# Patient Record
Sex: Male | Born: 1980 | Race: Black or African American | Hispanic: No | Marital: Single | Smoking: Current every day smoker
Health system: Southern US, Community
[De-identification: ages and names within clinical notes are randomized; demographics above are authoritative.]

---

## 2009-03-10 ENCOUNTER — Emergency Department (HOSPITAL_COMMUNITY): Admission: EM | Admit: 2009-03-10 | Discharge: 2009-03-10 | Payer: Self-pay | Admitting: Emergency Medicine

## 2014-02-18 ENCOUNTER — Emergency Department (HOSPITAL_BASED_OUTPATIENT_CLINIC_OR_DEPARTMENT_OTHER)
Admission: EM | Admit: 2014-02-18 | Discharge: 2014-02-18 | Disposition: A | Discharge status: Home / Self Care | Payer: Self-pay | Attending: Emergency Medicine | Admitting: Emergency Medicine

## 2014-02-18 ENCOUNTER — Encounter (HOSPITAL_BASED_OUTPATIENT_CLINIC_OR_DEPARTMENT_OTHER): Payer: Self-pay | Admitting: Emergency Medicine

## 2014-02-18 DIAGNOSIS — N342 Other urethritis: Secondary | ICD-10-CM | POA: Insufficient documentation

## 2014-02-18 DIAGNOSIS — R300 Dysuria: Secondary | ICD-10-CM | POA: Insufficient documentation

## 2014-02-18 DIAGNOSIS — F17200 Nicotine dependence, unspecified, uncomplicated: Secondary | ICD-10-CM | POA: Insufficient documentation

## 2014-02-18 LAB — RPR

## 2014-02-18 LAB — HIV ANTIBODY (ROUTINE TESTING W REFLEX): HIV 1&2 Ab, 4th Generation: NONREACTIVE

## 2014-02-18 MED ORDER — CEFTRIAXONE SODIUM 250 MG IJ SOLR
250.0000 mg | Freq: Once | INTRAMUSCULAR | Status: AC
  Administered 2014-02-18: 250 mg via INTRAMUSCULAR
  Filled 2014-02-18: qty 250

## 2014-02-18 MED ORDER — AZITHROMYCIN 250 MG PO TABS
1000.0000 mg | ORAL_TABLET | Freq: Once | ORAL | Status: AC
  Administered 2014-02-18: 1000 mg via ORAL
  Filled 2014-02-18: qty 4

## 2014-02-18 NOTE — ED Provider Notes (Signed)
CSN: [id number]     Arrival date & time 02/18/14  3086 History   First MD Initiated Contact with Patient 02/18/14 [phone]     Chief Complaint  Patient presents with  . Dysuria     (Consider location/radiation/quality/duration/timing/severity/associated sxs/prior Treatment) HPI This is a 33 year old male with a three-day history of dysuria. He complains of burning during urination. The symptoms are mild to moderate. He denies urethral discharge. He denies fever, chills, nausea, vomiting, diarrhea or abdominal pain. He states he is married and monogamous.  History reviewed. No pertinent past medical history. History reviewed. No pertinent past surgical history. No family history on file. History  Substance Use Topics  . Smoking status: Current Every Day Smoker  . Smokeless tobacco: Not on file  . Alcohol Use: Yes    Review of Systems  All other systems reviewed and are negative.   Allergies  Review of patient's allergies indicates no known allergies.  Home Medications   Prior to Admission medications   Not on File   BP 120/73  Pulse 90  Temp(Src) 99.8 F (37.7 C) (Oral)  Resp 16  Ht  (1.702 m)  Wt 212 lb (96.163 kg)  BMI 33.20 kg/m2  SpO2 99%  Physical Exam General: Well-developed, well-nourished male in no acute distress; appearance consistent with age of record HENT: normocephalic; atraumatic Eyes: Normal appearance Neck: supple Heart: regular rate and rhythm Lungs: clear to auscultation bilaterally Abdomen: soft; nondistended; nontender; no masses or hepatosplenomegaly; bowel sounds present GU: Tanner 5 male, uncircumcised; no urethral discharge; urine clear yellow Extremities: No deformity; full range of motion Neurologic: Awake, alert and oriented; motor function intact in all extremities and symmetric; no facial droop Skin: Warm and dry Psychiatric: Normal mood and affect    ED Course  Procedures (including critical care time)  MDM  Will test for  STDs and treat presumptively for GC and chlamydia.     Hanley Seamen, MD 02/18/14 [phone]

## 2014-02-18 NOTE — Discharge Instructions (Signed)
Urethritis °Urethritis is an inflammation of the tube through which urine exits your bladder (urethra).  °CAUSES °Urethritis is often caused by an infection in your urethra. The infection can be viral, like herpes. The infection can also be bacterial, like gonorrhea. °RISK FACTORS °Risk factors of urethritis include: °· Having sex without using a condom. °· Having multiple sexual partners. °· Having poor hygiene. °SIGNS AND SYMPTOMS °Symptoms of urethritis are less noticeable in women than in men. These symptoms include: °· Burning feeling when you urinate (dysuria). °· Discharge from your urethra. °· Blood in your urine (hematuria). °· Urinating more than usual. °DIAGNOSIS  °To confirm a diagnosis of urethritis, your health care provider will do the following: °· Ask about your sexual history. °· Perform a physical exam. °· Have you provide a sample of your urine for lab testing. °· Use a cotton swab to gently collect a sample from your urethra for lab testing. °TREATMENT  °It is important to treat urethritis. Depending on the cause, untreated urethritis may lead to serious genital infections and possibly infertility. Urethritis caused by a bacterial infection is treated with antibiotic medicine. All sexual partners must be treated.  °HOME CARE INSTRUCTIONS °· Do not have sex until the test results are known and treatment is completed, even if your symptoms go away before you finish treatment. °· If you were prescribed an antibiotic, finish it all even if you start to feel better. °SEEK MEDICAL CARE IF:  °· Your symptoms are not improved in 3 days. °· Your symptoms are getting worse. °· You develop abdominal pain or pelvic pain (in women). °· You develop joint pain. °· You have a fever. °SEEK IMMEDIATE MEDICAL CARE IF:  °· You have severe pain in the belly, back, or side. °· You have repeated vomiting. °MAKE SURE YOU: °· Understand these instructions. °· Will watch your condition. °· Will get help right away if you  are not doing well or get worse. °Document Released: 11/25/2000 Document Revised: 10/16/2013 Document Reviewed: 01/30/2013 °ExitCare® Patient Information ©2015 [employer]. This information is not intended to replace advice given to you by your health care provider. Make sure you discuss any questions you have with your health care provider. ° °

## 2014-02-18 NOTE — ED Notes (Addendum)
Pt up to b/r for urine sample, CCUA explained, alert, NAD, calm, interactive, steady gait.

## 2014-02-18 NOTE — ED Notes (Addendum)
C/o intermittent dysuria, onset 3-4d ago, (denies: abd pain, back pain, itching, d/c, nvd, fever or other sx), no meds PTA. Denies pain or discomfort at this time. No PCP.

## 2014-02-19 LAB — URINE CULTURE
COLONY COUNT: NO GROWTH
CULTURE: NO GROWTH

## 2014-02-20 LAB — GC/CHLAMYDIA PROBE AMP
CT Probe RNA: POSITIVE — AB
GC PROBE AMP APTIMA: NEGATIVE

## 2014-02-21 ENCOUNTER — Telehealth (HOSPITAL_BASED_OUTPATIENT_CLINIC_OR_DEPARTMENT_OTHER): Payer: Self-pay | Admitting: Emergency Medicine

## 2014-02-21 NOTE — Telephone Encounter (Signed)
Post ED Visit - Positive Culture Follow-up  Culture report reviewed by antimicrobial stewardship pharmacist:  Wes Dulaney, Pharm.D., BCPS  Celedonio Miyamoto, Pharm.D., BCPS  Georgina Pillion, [address].D., BCPS  [place], [address].D., BCPS, AAHIVP  Estella Husk, Pharm.D., BCPS, AAHIVP  Carly Sabat, Pharm.D.  Enzo Bi, Pharm.D.  Positive chlamydia culture Treated with zithromax and rocephin, organism sensitive to the same and no further patient follow-up is required at this time.  Berle Mull 02/21/2014, 12:50 PM

## 2014-04-11 ENCOUNTER — Emergency Department (HOSPITAL_BASED_OUTPATIENT_CLINIC_OR_DEPARTMENT_OTHER): Payer: Self-pay

## 2014-04-11 ENCOUNTER — Encounter (HOSPITAL_BASED_OUTPATIENT_CLINIC_OR_DEPARTMENT_OTHER): Payer: Self-pay | Admitting: Emergency Medicine

## 2014-04-11 ENCOUNTER — Emergency Department (HOSPITAL_BASED_OUTPATIENT_CLINIC_OR_DEPARTMENT_OTHER)
Admission: EM | Admit: 2014-04-11 | Discharge: 2014-04-11 | Disposition: A | Discharge status: Home / Self Care | Payer: Self-pay | Attending: Emergency Medicine | Admitting: Emergency Medicine

## 2014-04-11 DIAGNOSIS — Z720 Tobacco use: Secondary | ICD-10-CM | POA: Insufficient documentation

## 2014-04-11 DIAGNOSIS — W19XXXA Unspecified fall, initial encounter: Secondary | ICD-10-CM

## 2014-04-11 DIAGNOSIS — M545 Low back pain, unspecified: Secondary | ICD-10-CM

## 2014-04-11 DIAGNOSIS — W109XXA Fall (on) (from) unspecified stairs and steps, initial encounter: Secondary | ICD-10-CM | POA: Insufficient documentation

## 2014-04-11 DIAGNOSIS — Z791 Long term (current) use of non-steroidal anti-inflammatories (NSAID): Secondary | ICD-10-CM | POA: Insufficient documentation

## 2014-04-11 DIAGNOSIS — Y9289 Other specified places as the place of occurrence of the external cause: Secondary | ICD-10-CM | POA: Insufficient documentation

## 2014-04-11 DIAGNOSIS — Y9389 Activity, other specified: Secondary | ICD-10-CM | POA: Insufficient documentation

## 2014-04-11 DIAGNOSIS — S3992XA Unspecified injury of lower back, initial encounter: Secondary | ICD-10-CM | POA: Insufficient documentation

## 2014-04-11 MED ORDER — IBUPROFEN 800 MG PO TABS: 800.0000 mg | ORAL_TABLET | Freq: Three times a day (TID) | ORAL | Status: DC

## 2014-04-11 NOTE — ED Notes (Signed)
Slipped/fell yesterday-c/o lower back pain-steady gait into triage

## 2014-04-11 NOTE — ED Notes (Signed)
Pt reports slipping on stairs and falling down backwards approximately 3 steps. Reports more painful with movement. Rates pain 6/10 at this time. Able to move all extremities without difficulty.

## 2014-04-11 NOTE — ED Provider Notes (Signed)
CSN: [id number]     Arrival date & time 04/11/14  1208 History   First MD Initiated Contact with Patient 04/11/14 1223     Chief Complaint  Patient presents with  . Fall     (Consider location/radiation/quality/duration/timing/severity/associated sxs/prior Treatment) HPI Comments: This is a 33 year old male who presents to the emergency department complaining of low back pain 1 day. Patient reports he slipped on the steps around 5:30 PM yesterday and hit his lower back on the step. Pain has been intermittent since, worse with sitting, relieved by standing and icy hot patch. Denies pain, numbness or tingling radiating down his extremities. No loss control of bowels or bladder saddle anesthesia.  Patient is a 33 y.o. male presenting with fall. The history is provided by the patient.  Fall    History reviewed. No pertinent past medical history. History reviewed. No pertinent past surgical history. No family history on file. History  Substance Use Topics  . Smoking status: Current Every Day Smoker  . Smokeless tobacco: Not on file  . Alcohol Use: Yes    Review of Systems  Musculoskeletal: Positive for back pain.  All other systems reviewed and are negative.     Allergies  Review of patient's allergies indicates no known allergies.  Home Medications   Prior to Admission medications   Medication Sig Start Date End Date Taking? Authorizing Provider  acetaminophen (TYLENOL) 500 MG tablet Take 500 mg by mouth every 6 (six) hours as needed.   Yes Historical Provider, MD  ibuprofen (ADVIL,MOTRIN) 800 MG tablet Take 1 tablet (800 mg total) by mouth 3 (three) times daily. 04/11/14   Amilliana Hayworth M Jaonna Word, PA-C   BP 126/88  Pulse 100  Temp(Src) 98.4 F (36.9 C) (Oral)  Resp 16  Ht 5\' 11"  (1.803 m)  Wt 200 lb (90.719 kg)  BMI 27.91 kg/m2  SpO2 100% Physical Exam  Nursing note and vitals reviewed. Constitutional: He is oriented to person, place, and time. He appears well-developed and  well-nourished. No distress.  HENT:  Head: Normocephalic and atraumatic.  Mouth/Throat: Oropharynx is clear and moist.  Eyes: Conjunctivae are normal.  Neck: Normal range of motion. Neck supple. No spinous process tenderness and no muscular tenderness present.  Cardiovascular: Normal rate, regular rhythm and normal heart sounds.   Pulmonary/Chest: Effort normal and breath sounds normal. No respiratory distress.  Musculoskeletal: He exhibits no edema.       Lumbar back: He exhibits normal range of motion.       Back:  Neurological: He is alert and oriented to person, place, and time. He has normal strength.  Strength lower extremities 5/5 and equal bilateral. Sensation intact. Normal gait.  Skin: Skin is warm and dry. No rash noted. He is not diaphoretic.  Psychiatric: He has a normal mood and affect. His behavior is normal.    ED Course  Procedures (including critical care time) Labs Review Labs Reviewed - No data to display  Imaging Review Dg Lumbar Spine Complete  04/11/2014   CLINICAL DATA:  Lower back pain after fall down stairs.  EXAM: LUMBAR SPINE - COMPLETE 4+ VIEW  COMPARISON:  None.  FINDINGS: There is no evidence of lumbar spine fracture. Alignment is normal. Intervertebral disc spaces are maintained.  IMPRESSION: Normal lumbar spine.   Electronically Signed   By: Roque LiasJames  Green M.D.   On: 04/11/2014 13:35     EKG Interpretation None      MDM   Final diagnoses:  Fall  Lumbar pain  on palpation   Patient with back pain after fall. Neurovascularly intact. No neurologic deficits. No signs of central cord compression. Ambulates without difficulty. X-ray without any acute findings. Advised NSAIDs, rest, ice/heat. Stable for discharge. Return precautions given. Patient states understanding of treatment care plan and is agreeable.  Kathrynn SpeedRobyn M Elfego Giammarino, PA-C 04/11/14 1348

## 2014-04-11 NOTE — Discharge Instructions (Signed)
Take ibuprofen as directed as needed for pain. Rest, ice and avoid heavy lifting or physical activity for the next few days.  Lumbosacral Strain Lumbosacral strain is a strain of any of the parts that make up your lumbosacral vertebrae. Your lumbosacral vertebrae are the bones that make up the lower third of your backbone. Your lumbosacral vertebrae are held together by muscles and tough, fibrous tissue (ligaments).  CAUSES  A sudden blow to your back can cause lumbosacral strain. Also, anything that causes an excessive stretch of the muscles in the low back can cause this strain. This is typically seen when people exert themselves strenuously, fall, lift heavy objects, bend, or crouch repeatedly. RISK FACTORS  Physically demanding work.  Participation in pushing or pulling sports or sports that require a sudden twist of the back (tennis, golf, baseball).  Weight lifting.  Excessive lower back curvature.  Forward-tilted pelvis.  Weak back or abdominal muscles or both.  Tight hamstrings. SIGNS AND SYMPTOMS  Lumbosacral strain may cause pain in the area of your injury or pain that moves (radiates) down your leg.  DIAGNOSIS Your health care provider can often diagnose lumbosacral strain through a physical exam. In some cases, you may need tests such as X-ray exams.  TREATMENT  Treatment for your lower back injury depends on many factors that your clinician will have to evaluate. However, most treatment will include the use of anti-inflammatory medicines. HOME CARE INSTRUCTIONS   Avoid hard physical activities (tennis, racquetball, waterskiing) if you are not in proper physical condition for it. This may aggravate or create problems.  If you have a back problem, avoid sports requiring sudden body movements. Swimming and walking are generally safer activities.  Maintain good posture.  Maintain a healthy weight.  For acute conditions, you may put ice on the injured area.  Put ice in  a plastic bag.  Place a towel between your skin and the bag.  Leave the ice on for 20 minutes, 2-3 times a day.  When the low back starts healing, stretching and strengthening exercises may be recommended. SEEK MEDICAL CARE IF:  Your back pain is getting worse.  You experience severe back pain not relieved with medicines. SEEK IMMEDIATE MEDICAL CARE IF:   You have numbness, tingling, weakness, or problems with the use of your arms or legs.  There is a change in bowel or bladder control.  You have increasing pain in any area of the body, including your belly (abdomen).  You notice shortness of breath, dizziness, or feel faint.  You feel sick to your stomach (nauseous), are throwing up (vomiting), or become sweaty.  You notice discoloration of your toes or legs, or your feet get very cold. MAKE SURE YOU:   Understand these instructions.  Will watch your condition.  Will get help right away if you are not doing well or get worse. Document Released: 03/11/2005 Document Revised: 06/06/2013 Document Reviewed: 01/18/2013 [facility] LLCExitCare Patient Information 2015 [place]Care, [place]. This information is not intended to replace advice given to you by your health care provider. Make sure you discuss any questions you have with your health care provider.  Musculoskeletal Pain Musculoskeletal pain is muscle and boney aches and pains. These pains can occur in any part of the body. Your caregiver may treat you without knowing the cause of the pain. They may treat you if blood or urine tests, X-rays, and other tests were normal.  CAUSES There is often not a definite cause or reason for these pains.  These pains may be caused by a type of germ (virus). The discomfort may also come from overuse. Overuse includes working out too hard when your body is not fit. Boney aches also come from weather changes. Bone is sensitive to atmospheric pressure changes. HOME CARE INSTRUCTIONS   Ask when your test results  will be ready. Make sure you get your test results.  Only take over-the-counter or prescription medicines for pain, discomfort, or fever as directed by your caregiver. If you were given medications for your condition, do not drive, operate machinery or power tools, or sign legal documents for 24 hours. Do not drink alcohol. Do not take sleeping pills or other medications that may interfere with treatment.  Continue all activities unless the activities cause more pain. When the pain lessens, slowly resume normal activities. Gradually increase the intensity and duration of the activities or exercise.  During periods of severe pain, bed rest may be helpful. Lay or sit in any position that is comfortable.  Putting ice on the injured area.  Put ice in a bag.  Place a towel between your skin and the bag.  Leave the ice on for 15 to 20 minutes, 3 to 4 times a day.  Follow up with your caregiver for continued problems and no reason can be found for the pain. If the pain becomes worse or does not go away, it may be necessary to repeat tests or do additional testing. Your caregiver may need to look further for a possible cause. SEEK IMMEDIATE MEDICAL CARE IF:  You have pain that is getting worse and is not relieved by medications.  You develop chest pain that is associated with shortness or breath, sweating, feeling sick to your stomach (nauseous), or throw up (vomit).  Your pain becomes localized to the abdomen.  You develop any new symptoms that seem different or that concern you. MAKE SURE YOU:   Understand these instructions.  Will watch your condition.  Will get help right away if you are not doing well or get worse. Document Released: 06/01/2005 Document Revised: 08/24/2011 Document Reviewed: 02/03/2013 [facility]ExitCare Patient Information 2015 [place]ExitCare, [place]LLC. This information is not intended to replace advice given to you by your health care provider. Make sure you discuss any questions you  have with your health care provider.

## 2014-04-13 NOTE — ED Provider Notes (Signed)
Medical screening examination/treatment/procedure(s) were performed by non-physician practitioner and as supervising physician I was immediately available for consultation/collaboration.   EKG Interpretation None        Mirian MoMatthew Gentry, MD 04/13/14 [phone]

## 2015-02-12 ENCOUNTER — Emergency Department (HOSPITAL_BASED_OUTPATIENT_CLINIC_OR_DEPARTMENT_OTHER)
Admission: EM | Admit: 2015-02-12 | Discharge: 2015-02-12 | Disposition: A | Discharge status: Home / Self Care | Payer: Self-pay | Attending: Emergency Medicine | Admitting: Emergency Medicine

## 2015-02-12 ENCOUNTER — Encounter (HOSPITAL_BASED_OUTPATIENT_CLINIC_OR_DEPARTMENT_OTHER): Payer: Self-pay | Admitting: *Deleted

## 2015-02-12 DIAGNOSIS — K047 Periapical abscess without sinus: Secondary | ICD-10-CM | POA: Insufficient documentation

## 2015-02-12 DIAGNOSIS — Z720 Tobacco use: Secondary | ICD-10-CM | POA: Insufficient documentation

## 2015-02-12 DIAGNOSIS — K029 Dental caries, unspecified: Secondary | ICD-10-CM | POA: Insufficient documentation

## 2015-02-12 MED ORDER — PENICILLIN V POTASSIUM 500 MG PO TABS: 500.0000 mg | ORAL_TABLET | Freq: Three times a day (TID) | ORAL | Status: AC

## 2015-02-12 MED ORDER — HYDROCODONE-ACETAMINOPHEN 5-325 MG PO TABS: 1.0000 | ORAL_TABLET | Freq: Four times a day (QID) | ORAL | Status: AC | PRN

## 2015-02-12 NOTE — ED Provider Notes (Signed)
CSN: [id number]     Arrival date & time 02/12/15  0931 History   First MD Initiated Contact with Patient 02/12/15 1011     No chief complaint on file.    (Consider location/radiation/quality/duration/timing/severity/associated sxs/prior Treatment) HPI Comments: Patient is a 34 year old male who presents with complaints of right upper dental pain and facial swelling for the past 3 days.  Patient is a 34 y.o. male presenting with tooth pain. The history is provided by the patient.  Dental Pain Location:  Upper Upper teeth location:  2/RU 2nd molar Quality:  Throbbing Severity:  Moderate Onset quality:  Gradual Duration:  3 days Timing:  Constant Progression:  Worsening Chronicity:  New   History reviewed. No pertinent past medical history. History reviewed. No pertinent past surgical history. No family history on file. Social History  Substance Use Topics  . Smoking status: Current Every Day Smoker -- 0.50 packs/day    Types: Cigarettes  . Smokeless tobacco: None  . Alcohol Use: Yes    Review of Systems  All other systems reviewed and are negative.     Allergies  Review of patient's allergies indicates no known allergies.  Home Medications   Prior to Admission medications   Not on File   BP 148/97 mmHg  Pulse 67  Temp(Src) 100.3 F (37.9 C) (Oral)  Resp 16  Ht  (1.803 m)  Wt 185 lb (83.915 kg)  BMI 25.81 kg/m2  SpO2 98% Physical Exam  Constitutional: He is oriented to person, place, and time. He appears well-developed and well-nourished. No distress.  HENT:  Head: Normocephalic and atraumatic.  The right upper second molar is heavily decayed with gingival inflammation, but no obvious abscess. There is swelling to the right cheek.  Neck: Normal range of motion. Neck supple.  Neurological: He is alert and oriented to person, place, and time.  Skin: Skin is warm and dry. He is not diaphoretic.  Nursing note and vitals reviewed.   ED Course   Procedures (including critical care time) Labs Review Labs Reviewed - No data to display  Imaging Review No results found. I have personally reviewed and evaluated these images and lab results as part of my medical decision-making.   EKG Interpretation None      MDM   Final diagnoses:  None    Will treat with penicillin, pain medication, and follow-up with dentistry.    Geoffery Lyons, MD 02/12/15 1016

## 2015-02-12 NOTE — Discharge Instructions (Signed)
Penicillin as prescribed.  Hydrocodone as prescribed as needed for pain.  Follow-up with dentistry in the next 2-3 days.   Dental Abscess A dental abscess is a collection of infected fluid (pus) from a bacterial infection in the inner part of the tooth (pulp). It usually occurs at the end of the tooth's root.  CAUSES   Severe tooth decay.  Trauma to the tooth that allows bacteria to enter into the pulp, such as a broken or chipped tooth. SYMPTOMS   Severe pain in and around the infected tooth.  Swelling and redness around the abscessed tooth or in the mouth or face.  Tenderness.  Pus drainage.  Bad breath.  Bitter taste in the mouth.  Difficulty swallowing.  Difficulty opening the mouth.  Nausea.  Vomiting.  Chills.  Swollen neck glands. DIAGNOSIS   A medical and dental history will be taken.  An examination will be performed by tapping on the abscessed tooth.  X-rays may be taken of the tooth to identify the abscess. TREATMENT The goal of treatment is to eliminate the infection. You may be prescribed antibiotic medicine to stop the infection from spreading. A root canal may be performed to save the tooth. If the tooth cannot be saved, it may be pulled (extracted) and the abscess may be drained.  HOME CARE INSTRUCTIONS  Only take over-the-counter or prescription medicines for pain, fever, or discomfort as directed by your caregiver.  Rinse your mouth (gargle) often with salt water ( tsp salt in 8 oz [250 ml] of warm water) to relieve pain or swelling.  Do not drive after taking pain medicine (narcotics).  Do not apply heat to the outside of your face.  Return to your dentist for further treatment as directed. SEEK MEDICAL CARE IF:  Your pain is not helped by medicine.  Your pain is getting worse instead of better. SEEK IMMEDIATE MEDICAL CARE IF:  You have a fever or persistent symptoms for more than 2-3 days.  You have a fever and your symptoms  suddenly get worse.  You have chills or a very bad headache.  You have problems breathing or swallowing.  You have trouble opening your mouth.  You have swelling in the neck or around the eye. Document Released: 06/01/2005 Document Revised: 02/24/2012 Document Reviewed: 09/09/2010 [facility] Patient Information 2015 [place], [place]. This information is not intended to replace advice given to you by your health care provider. Make sure you discuss any questions you have with your health care provider.   Emergency Department Resource Guide 1) Find a Doctor and Pay Out of Pocket Although you won't have to find out who is covered by your insurance plan, it is a good idea to ask around and get recommendations. You will then need to call the office and see if the doctor you have chosen will accept you as a new patient and what types of options they offer for patients who are self-pay. Some doctors offer discounts or will set up payment plans for their patients who do not have insurance, but you will need to ask so you aren't surprised when you get to your appointment.  2) Contact Your Local Health Department Not all health departments have doctors that can see patients for sick visits, but many do, so it is worth a call to see if yours does. If you don't know where your local health department is, you can check in your phone book. The CDC also has a tool to help you locate your  state's health department, and many state websites also have listings of all of their local health departments.  3) Find a Walk-in Clinic If your illness is not likely to be very severe or complicated, you may want to try a walk in clinic. These are popping up all over the country in pharmacies, drugstores, and shopping centers. They're usually staffed by nurse practitioners or physician assistants that have been trained to treat common illnesses and complaints. They're usually fairly quick and inexpensive. However, if you have  serious medical issues or chronic medical problems, these are probably not your best option.  No Primary Care Doctor: - Call Health Connect at  [phone] - they can help you locate a primary care doctor that  accepts your insurance, provides certain services, etc. - Physician Referral Service- [phone]  Chronic Pain Problems: Organization         Address  Phone   Notes  [facility]  [phone] Patients need to be referred by their primary care doctor.   Medication Assistance: Organization         Address  Phone   Notes  [facility] [address] [place], [place] [address] [phone]) [phone] --Must be a resident of [facility] -- Must have NO insurance coverage whatsoever (no Medicaid/ Medicare, etc.) -- The pt. MUST have a primary care doctor that directs their care regularly and follows them in the community   MedAssist  [phone]   [employer]  [phone]    Agencies that provide inexpensive medical care: Organization         Address  Phone   Notes  [facility]  [phone]   [facility]    [phone]   [facility] [address] [place], [place] [address] [phone]   [facility] [address], [place] [phone]   [facility]anned [facility]    [phone]   [facility]    [phone]   [facility] and [facility]  [address], [place] Phone:  [phone], Fax:  [phone] Hours of Operation:  9 am - 6 pm, M-F.  Also accepts Medicaid/Medicare and self-pay.  [facility]  [address], [place] Phone: [phone]) [phone], Fax: [phone]. Hours of Operation:  8:30 am - 5:30 pm, M-F.  Also accepts Medicaid and self-pay.  [facility] [place] [address], [place] Phone: [phone]) [phone]   [facility] [address],  [place], [place] [phone], Ext. [phone] Mondays & Thursdays: 7-9 AM.  First 15 patients are seen on a first come, first serve basis.    Medicaid-accepting [facility] Providers:  Organization         Address  Phone   Notes  [facility] [address], [place] [phone] Also accepts self-pay patients.  [facility] [address], [place]  [phone]   [facility] [address], [place] [phone]   [facility] [address] [place] [address], [place] [phone]   Renaye RakersVeita Bland [address], [place]   [phone] Only accepts [place] Access [place] patients after they have their name applied to their card.   Self-Pay (no insurance) in [facility]:  Organization  Address  Phone   Notes  Sickle Cell Patients, [facility] [place] [phone]   [facility] [phone]   [facility] [place], [place] [phone]   [facility]/Dr. Osei-Bonsu  [address] [place] or [address], [place] [phone] Phone number for both [place] and [place] locations is the same.  [facility] [address], [place] [phone]   [facility] [address], [place] or [address] [phone]   [facility] [address], [place] [phone], phone; [phone], fax Sees patients 1st and 3rd Saturday of every month.  Must not qualify for public or private insurance (i.e. Medicaid, Medicare, [place] Health Choice, Veterans' Benefits)  Household income should be no more than 200% of the poverty level The clinic cannot treat you if you are pregnant or think you are pregnant   Sexually transmitted diseases are not treated at the clinic.    Dental Care: Organization         Address  Phone  Notes  [facility] [phone] Accepts children up to age 57 who are enrolled in [place] or [place]; pregnant women with a Medicaid card; and children who have applied for Medicaid or [place] Health Choice, but were declined, whose parents can pay a reduced fee at time of service.  [facility] Department of [facility]  [address], [place] [phone] Accepts children up to age 70 who are enrolled in [place] or [place]; pregnant women with a Medicaid card; and children who have applied for Medicaid or [place] Health Choice, but were declined, whose parents can pay a reduced fee at time of service.  [facility] PRO[facility]  [place] [phone] Patients are seen by appointment only. Walk-ins are not accepted. [place] will see patients 34 years of age and older. Monday - Tuesday (8am-5pm) Most Wednesdays (8:30-5pm) $30 per visit, cash only  [facility] Adult Dental Access PROGRAM  [address], [facility] [phone] Patients are seen by appointment only. Walk-ins are not accepted. [place] will see patients 19 years of age and older. One Wednesday Evening (Monthly: Volunteer Based).  $30 per visit, cash only  [place]  [phone] for adults; Children under age 68, call Graduate Pediatric Dentistry at [phone]. Children aged 22-14, please call [phone] to request a pediatric application.  Dental services are provided in all areas of dental care including fillings, crowns and bridges, complete and partial dentures, implants, gum treatment, root canals, and extractions. Preventive care is also provided. Treatment is provided to both adults and children. Patients  are selected via a lottery and there is often a waiting list.   [facility] [address] [place]  [phone] [email or web address]   [facility] [address] [place], [place] [phone], Ext. [phone] Second and Fourth Thursday of each month, opens at 6:30 AM; Clinic ends at 9 AM.  Patients are seen on a first-come first-served basis, and a limited number are seen during each clinic.   [facility]  [address] [place], [place]  [place], [place] [phone]   Eligibility Requirements You must have lived in [place], [place], or [place] counties for at least the last three months.   You cannot be eligible for state or federal sponsored [employer], including [employer], [place], or [employer].   You generally cannot be eligible for healthcare insurance through your employer.    How to apply: Eligibility screenings are held every Tuesday and Wednesday afternoon from 1:00 pm until 4:00 pm. You do not need an appointment for the interview!  [facility] [address] [place], [place]   [place]  [place] Department  [place]  [phone]    Behavioral Health Resources in the Community: Intensive Outpatient Programs Organization         Address  Phone  Notes  Bastrop Overton. [address], [place], [place] [phone]   [facility] Outpatient [address] [place], [place]   ADS: [facility] [address], [place], [place]   [place] [address],  [place], [place] or [phone]   Substance Abuse Resources Organization         Address  Phone  Notes  Alcohol [facility] Drug Services  [phone]   [place]  [phone]   [facility] [place]   Chinita Pester  [phone]    Residential & Outpatient Substance Abuse Program  [phone]   Psychological Services Organization         Address  Phone  Notes  [facility]  [place]  [phone]   [place] [address] [place] or [phone]    Mobile Crisis Teams Organization         Address  Phone  Notes  [facility], Mobile Crisis Care Unit  [phone]   [facility]  [address] [place], [place]   [place] Levels [address] [place] [phone]    Self-Help/Support Groups Organization         Address  Phone             Notes  [place]. of [place] - variety of support groups  [place] Call for more information  Narcotics Anonymous (NA), [facility] [address], [employer] [address] [place] [place]  2 meetings at this location   Special educational needs teacher         Address  Phone  Notes  [facility]idential [facility] [place],    [place]  [phone]   [facility]  [address] [place] [address]92, [place], [place]   Quentin Reynolds, [place] [phone] Admissions: 8am-3pm M-F  Incentives Substance Short [address],    [place], [place] [phone]   [facility] [address] Jadene Pierini [place], [place]   [facility] [address],  [place], [place]   [facility] - Intensive Outpatient [address], [place], [place]   [facility] [place].,  [place], [place] or [phone]   [facility] (RTS) [address] [place], [place] Accepts Medicaid  [facility] [place] [address]  [place] [phone] Substance Abuse/Addiction Treatment   [facility]  Resources Organization  Address  Phone  Notes  °[facility]  [phone]) [phone]   °Julie Brannon, PhD [address] [place], [place]   ([phone]) [phone] or ([phone]   ° Behavioral   [address] °[place], [employer] [phone]   °[facility] [address], [place], [place] [phone] Insurance/Medicaid/sponsorship through [facility]  °[facility] [address]                                    [place], [place] [phone] Therapy/tele-psych/case  °[facility] [address]  ° [place], [place] [phone]    °Dr. Arfeen  ([phone]   °[facility] [place]  [employer] [place] [facility] 1) [address],  °2) [address], [place] °3)  [address], [place] [phone]) [phone] °([phone] ° °([phone]) [phone]   °[place] Child Abuse Hotline (336) 342-1394 or (336) [phone] (After Hours)    ° ° ° ° °

## 2015-02-12 NOTE — ED Notes (Signed)
C/o right upper toothache since Sunday. Right jaw is swollen.

## 2015-08-17 ENCOUNTER — Emergency Department (HOSPITAL_BASED_OUTPATIENT_CLINIC_OR_DEPARTMENT_OTHER): Payer: Self-pay

## 2015-08-17 ENCOUNTER — Emergency Department (HOSPITAL_BASED_OUTPATIENT_CLINIC_OR_DEPARTMENT_OTHER)
Admission: EM | Admit: 2015-08-17 | Discharge: 2015-08-17 | Disposition: A | Discharge status: Home / Self Care | Payer: Self-pay | Attending: Emergency Medicine | Admitting: Emergency Medicine

## 2015-08-17 ENCOUNTER — Encounter (HOSPITAL_BASED_OUTPATIENT_CLINIC_OR_DEPARTMENT_OTHER): Payer: Self-pay | Admitting: *Deleted

## 2015-08-17 DIAGNOSIS — F17210 Nicotine dependence, cigarettes, uncomplicated: Secondary | ICD-10-CM | POA: Insufficient documentation

## 2015-08-17 DIAGNOSIS — Z792 Long term (current) use of antibiotics: Secondary | ICD-10-CM | POA: Insufficient documentation

## 2015-08-17 DIAGNOSIS — J069 Acute upper respiratory infection, unspecified: Secondary | ICD-10-CM | POA: Insufficient documentation

## 2015-08-17 NOTE — Discharge Instructions (Signed)
Upper Respiratory Infection, Adult Most upper respiratory infections (URIs) are a viral infection of the air passages leading to the lungs. A URI affects the nose, throat, and upper air passages. The most common type of URI is nasopharyngitis and is typically referred to as "the common cold." URIs run their course and usually go away on their own. Most of the time, a URI does not require medical attention, but sometimes a bacterial infection in the upper airways can follow a viral infection. This is called a secondary infection. Sinus and middle ear infections are common types of secondary upper respiratory infections. Bacterial pneumonia can also complicate a URI. A URI can worsen asthma and chronic obstructive pulmonary disease (COPD). Sometimes, these complications can require emergency medical care and may be life threatening.  CAUSES Almost all URIs are caused by viruses. A virus is a type of germ and can spread from one person to another.  RISKS FACTORS You may be at risk for a URI if:   You smoke.   You have chronic heart or lung disease.  You have a weakened defense (immune) system.   You are very young or very old.   You have nasal allergies or asthma.  You work in crowded or poorly ventilated areas.  You work in health care facilities or schools. SIGNS AND SYMPTOMS  Symptoms typically develop 2-3 days after you come in contact with a cold virus. Most viral URIs last 7-10 days. However, viral URIs from the influenza virus (flu virus) can last 14-18 days and are typically more severe. Symptoms may include:   Runny or stuffy (congested) nose.   Sneezing.   Cough.   Sore throat.   Headache.   Fatigue.   Fever.   Loss of appetite.   Pain in your forehead, behind your eyes, and over your cheekbones (sinus pain).  Muscle aches.  DIAGNOSIS  Your health care provider may diagnose a URI by:  Physical exam.  Tests to check that your symptoms are not due to  another condition such as:  Strep throat.  Sinusitis.  Pneumonia.  Asthma. TREATMENT  A URI goes away on its own with time. It cannot be cured with medicines, but medicines may be prescribed or recommended to relieve symptoms. Medicines may help:  Reduce your fever.  Reduce your cough.  Relieve nasal congestion. HOME CARE INSTRUCTIONS   Take medicines only as directed by your health care provider.   Gargle warm saltwater or take cough drops to comfort your throat as directed by your health care provider.  Use a warm mist humidifier or inhale steam from a shower to increase air moisture. This may make it easier to breathe.  Drink enough fluid to keep your urine clear or pale yellow.   Eat soups and other clear broths and maintain good nutrition.   Rest as needed.   Return to work when your temperature has returned to normal or as your health care provider advises. You may need to stay home longer to avoid infecting others. You can also use a face mask and careful hand washing to prevent spread of the virus.  Increase the usage of your inhaler if you have asthma.   Do not use any tobacco products, including cigarettes, chewing tobacco, or electronic cigarettes. If you need help quitting, ask your health care provider. PREVENTION  The best way to protect yourself from getting a cold is to practice good hygiene.   Avoid oral or hand contact with people with cold   symptoms.   Wash your hands often if contact occurs.  There is no clear evidence that vitamin C, vitamin E, echinacea, or exercise reduces the chance of developing a cold. However, it is always recommended to get plenty of rest, exercise, and practice good nutrition.  SEEK MEDICAL CARE IF:   You are getting worse rather than better.   Your symptoms are not controlled by medicine.   You have chills.  You have worsening shortness of breath.  You have brown or red mucus.  You have yellow or brown nasal  discharge.  You have pain in your face, especially when you bend forward.  You have a fever.  You have swollen neck glands.  You have pain while swallowing.  You have white areas in the back of your throat. SEEK IMMEDIATE MEDICAL CARE IF:   You have severe or persistent:  Headache.  Ear pain.  Sinus pain.  Chest pain.  You have chronic lung disease and any of the following:  Wheezing.  Prolonged cough.  Coughing up blood.  A change in your usual mucus.  You have a stiff neck.  You have changes in your:  Vision.  Hearing.  Thinking.  Mood. MAKE SURE YOU:   Understand these instructions.  Will watch your condition.  Will get help right away if you are not doing well or get worse.   This information is not intended to replace advice given to you by your health care provider. Make sure you discuss any questions you have with your health care provider.   Document Released: 11/25/2000 Document Revised: 10/16/2014 Document Reviewed: 09/06/2013 Elsevier Interactive Patient Education 2016 Elsevier Inc.  

## 2015-08-17 NOTE — ED Notes (Signed)
Pt returned from XR at this time. 

## 2015-08-17 NOTE — ED Provider Notes (Signed)
CSN: [id number]     Arrival date & time 08/17/15  1241 History   First MD Initiated Contact with Patient 08/17/15 1253     Chief Complaint  Patient presents with  . Fever     (Consider location/radiation/quality/duration/timing/severity/associated sxs/prior Treatment) HPI   Sean Hardin is a 35 y.o. male with no significant PMH who presents with subjective fever x 4-5 days with associated rhinorrhea, cough, and chills.  Denies headache, neck pain/stiffness, ear pain, sore throat, N/V/D, or abdominal pain.  Patient reports he has been taking Theraflu for his symptoms.  Patient reports he seems to be feeling better.    History reviewed. No pertinent past medical history. History reviewed. No pertinent past surgical history. No family history on file. Social History  Substance Use Topics  . Smoking status: Current Every Day Smoker -- 0.50 packs/day    Types: Cigarettes  . Smokeless tobacco: Never Used  . Alcohol Use: Yes     Comment: 3x week    Review of Systems All other systems negative unless otherwise stated in HPI    Allergies  Review of patient's allergies indicates no known allergies.  Home Medications   Prior to Admission medications   Medication Sig Start Date End Date Taking? Authorizing Provider  HYDROcodone-acetaminophen (NORCO) 5-325 MG per tablet Take 1-2 tablets by mouth every 6 (six) hours as needed. 02/12/15   Geoffery Lyons, MD  penicillin v potassium (VEETID) 500 MG tablet Take 1 tablet (500 mg total) by mouth 3 (three) times daily. 02/12/15   Geoffery Lyons, MD   BP 130/88 mmHg  Pulse 78  Temp(Src) 98.7 F (37.1 C) (Oral)  Resp 18  Ht 6' (1.829 m)  Wt 83.915 kg  BMI 25.08 kg/m2  SpO2 100% Physical Exam  Constitutional: He is oriented to person, place, and time. He appears well-developed and well-nourished.  Non-toxic appearance. He does not have a sickly appearance. He does not appear ill.  HENT:  Head: Normocephalic and atraumatic.  Mouth/Throat:  Oropharynx is clear and moist.  Eyes: Conjunctivae are normal. Pupils are equal, round, and reactive to light.  Neck: Normal range of motion. Neck supple.  No nuchal rigidity.   Cardiovascular: Normal rate, regular rhythm and normal heart sounds.   No murmur heard. Pulmonary/Chest: Effort normal and breath sounds normal. No accessory muscle usage or stridor. No respiratory distress. He has no wheezes. He has no rhonchi. He has no rales.  Abdominal: Soft. Bowel sounds are normal. He exhibits no distension. There is no tenderness.  Musculoskeletal: Normal range of motion.  Lymphadenopathy:    He has no cervical adenopathy.  Neurological: He is alert and oriented to person, place, and time.  Speech clear without dysarthria.  Skin: Skin is warm and dry.  Psychiatric: He has a normal mood and affect. His behavior is normal.    ED Course  Procedures (including critical care time) Labs Review Labs Reviewed - No data to display  Imaging Review No results found. I have personally reviewed and evaluated these images and lab results as part of my medical decision-making.   EKG Interpretation None      MDM   Final diagnoses:  URI (upper respiratory infection)    Patient presents with likely viral URI.  VSS, NAD.  Patient afebrile, non-toxic appearing.  No nuchal rigidity.  Lungs CTAB, heart RRR, abdomen soft and benign.  CXR to evaluate for PNA.  Plan to discharge home with symptomatic treatment.  Return precautions discussed.  All questions answered.  Patient agrees and acknowledges the above plan for discharge.     Cheri FowlerKayla Keaunna Skipper, PA-C 08/17/15 1413  Rolland PorterMark James, MD 08/26/15 Perlie Mayo0020

## 2015-08-17 NOTE — ED Notes (Signed)
Reports fever since last Sunday. Scratchy throat. Sinus drainage. Taking theraflu (last dose yesterday evening). Sx improving per pt

## 2015-08-24 ENCOUNTER — Encounter (HOSPITAL_BASED_OUTPATIENT_CLINIC_OR_DEPARTMENT_OTHER): Payer: Self-pay

## 2015-08-24 ENCOUNTER — Emergency Department (HOSPITAL_BASED_OUTPATIENT_CLINIC_OR_DEPARTMENT_OTHER)
Admission: EM | Admit: 2015-08-24 | Discharge: 2015-08-24 | Disposition: A | Discharge status: Home / Self Care | Payer: Self-pay | Attending: Emergency Medicine | Admitting: Emergency Medicine

## 2015-08-24 DIAGNOSIS — N451 Epididymitis: Secondary | ICD-10-CM | POA: Insufficient documentation

## 2015-08-24 DIAGNOSIS — Z792 Long term (current) use of antibiotics: Secondary | ICD-10-CM | POA: Insufficient documentation

## 2015-08-24 DIAGNOSIS — R350 Frequency of micturition: Secondary | ICD-10-CM | POA: Insufficient documentation

## 2015-08-24 DIAGNOSIS — Z711 Person with feared health complaint in whom no diagnosis is made: Secondary | ICD-10-CM

## 2015-08-24 DIAGNOSIS — N50812 Left testicular pain: Secondary | ICD-10-CM | POA: Insufficient documentation

## 2015-08-24 DIAGNOSIS — F17210 Nicotine dependence, cigarettes, uncomplicated: Secondary | ICD-10-CM | POA: Insufficient documentation

## 2015-08-24 DIAGNOSIS — N50819 Testicular pain, unspecified: Secondary | ICD-10-CM

## 2015-08-24 DIAGNOSIS — Z202 Contact with and (suspected) exposure to infections with a predominantly sexual mode of transmission: Secondary | ICD-10-CM | POA: Insufficient documentation

## 2015-08-24 LAB — URINALYSIS, ROUTINE W REFLEX MICROSCOPIC
Glucose, UA: NEGATIVE mg/dL
Hgb urine dipstick: NEGATIVE
KETONES UR: NEGATIVE mg/dL
Leukocytes, UA: NEGATIVE
NITRITE: NEGATIVE
PH: 6 (ref 5.0–8.0)
Protein, ur: NEGATIVE mg/dL
Specific Gravity, Urine: 1.03 (ref 1.005–1.030)

## 2015-08-24 MED ORDER — IBUPROFEN 800 MG PO TABS
800.0000 mg | ORAL_TABLET | Freq: Once | ORAL | Status: AC
  Administered 2015-08-24: 800 mg via ORAL
  Filled 2015-08-24: qty 1

## 2015-08-24 MED ORDER — AZITHROMYCIN 250 MG PO TABS
1000.0000 mg | ORAL_TABLET | Freq: Once | ORAL | Status: AC
  Administered 2015-08-24: 1000 mg via ORAL
  Filled 2015-08-24: qty 4

## 2015-08-24 MED ORDER — IBUPROFEN 600 MG PO TABS: 600.0000 mg | ORAL_TABLET | Freq: Four times a day (QID) | ORAL | Status: AC | PRN

## 2015-08-24 MED ORDER — CEFTRIAXONE SODIUM 250 MG IJ SOLR
250.0000 mg | Freq: Once | INTRAMUSCULAR | Status: AC
  Administered 2015-08-24: 250 mg via INTRAMUSCULAR
  Filled 2015-08-24: qty 250

## 2015-08-24 NOTE — ED Notes (Signed)
MD at bedside. 

## 2015-08-24 NOTE — ED Notes (Signed)
Pt c/o intermittent bilateral testicle pain for the last week with urinary urgency, here for STD testing, unsure if exposure, no penile discharge

## 2015-08-24 NOTE — ED Notes (Signed)
Pt verbalizes understanding of d/c instructions and denies any further needs at this time. 

## 2015-08-24 NOTE — Discharge Instructions (Signed)
He needs to inform sexual partners if your testing comes back positive. Abstain from sexual activity for the next 10 days.  Epididymitis Epididymitis is swelling (inflammation) of the epididymis. The epididymis is a cord-like structure that is located along the top and back part of the testicle. It collects and stores sperm from the testicle. This condition can also cause pain and swelling of the testicle and scrotum. Symptoms usually start suddenly (acute epididymitis). Sometimes epididymitis starts gradually and lasts for a while (chronic epididymitis). This type may be harder to treat. CAUSES In men 6135 and younger, this condition is usually caused by a bacterial infection or sexually transmitted disease (STD), such as:  Gonorrhea.  Chlamydia.  In men 1435 and older who do not have anal sex, this condition is usually caused by bacteria from a blockage or abnormalities in the urinary system. These can result from:  Having a tube placed into the bladder (urinary catheter).  Having an enlarged or inflamed prostate gland.  Having recent urinary tract surgery. In men who have a condition that weakens the body's defense system (immune system), such as HIV, this condition can be caused by:   Other bacteria, including tuberculosis and syphilis.  Viruses.  Fungi. Sometimes this condition occurs without infection. That may happen if urine flows backward into the epididymis after heavy lifting or straining. RISK FACTORS This condition is more likely to develop in men:  Who have unprotected sex with more than one partner.  Who have anal sex.   Who have recently had surgery.   Who have a urinary catheter.  Who have urinary problems.  Who have a suppressed immune system. SYMPTOMS  This condition usually begins suddenly with chills, fever, and pain behind the scrotum and in the testicle. Other symptoms include:   Swelling of the scrotum, testicle, or both.  Pain whenejaculatingor  urinating.  Pain in the back or belly.  Nausea.  Itching and discharge from the penis.  Frequent need to pass urine.  Redness and tenderness of the scrotum. DIAGNOSIS Your health care provider can diagnose this condition based on your symptoms and medical history. Your health care provider will also do a physical exam to ask about your symptoms and check your scrotum and testicle for swelling, pain, and redness. You may also have other tests, including:   Examination of discharge from the penis.  Urine tests for infections, such as STDs.  Your health care provider may test you for other STDs, including HIV. TREATMENT Treatment for this condition depends on the cause. If your condition is caused by a bacterial infection, oral antibiotic medicine may be prescribed. If the bacterial infection has spread to your blood, you may need to receive IV antibiotics. Nonbacterial epididymitis is treated with home care that includes bed rest and elevation of the scrotum. Surgery may be needed to treat:  Bacterial epididymitis that causes pus to build up in the scrotum (abscess).  Chronic epididymitis that has not responded to other treatments. HOME CARE INSTRUCTIONS Medicines  Take over-the-counter and prescription medicines only as told by your health care provider.   If you were prescribed an antibiotic medicine, take it as told by your health care provider. Do not stop taking the antibiotic even if your condition improves. Sexual Activity  If your epididymitis was caused by an STD, avoid sexual activity until your treatment is complete.  Inform your sexual partner or partners if you test positive for an STD. They may need to be treated.Do not engage in  sexual activity with your partner or partners until their treatment is completed. General Instructions  Return to your normal activities as told by your health care provider. Ask your health care provider what activities are safe for  you.  Keep your scrotum elevated and supported while resting. Ask your health care provider if you should wear a scrotal support, such as a jockstrap. Wear it as told by your health care provider.  If directed, apply ice to the affected area:   Put ice in a plastic bag.  Place a towel between your skin and the bag.  Leave the ice on for 20 minutes, 2-3 times per day.  Try taking a sitz bath to help with discomfort. This is a warm water bath that is taken while you are sitting down. The water should only come up to your hips and should cover your buttocks. Do this 3-4 times per day or as told by your health care provider.  Keep all follow-up visits as told by your health care provider. This is important. SEEK MEDICAL CARE IF:   You have a fever.   Your pain medicine is not helping.   Your pain is getting worse.   Your symptoms do not improve within three days.   This information is not intended to replace advice given to you by your health care provider. Make sure you discuss any questions you have with your health care provider.   Document Released: 05/29/2000 Document Revised: 02/20/2015 Document Reviewed: 10/17/2014 Elsevier Interactive Patient Education 2016 [employer].   Sexually Transmitted Disease A sexually transmitted disease (STD) is a disease or infection that may be passed (transmitted) from person to person, usually during sexual activity. This may happen by way of saliva, semen, blood, vaginal mucus, or urine. Common STDs include:  Gonorrhea.  Chlamydia.  Syphilis.  HIV and AIDS.  Genital herpes.  Hepatitis B and C.  Trichomonas.  Human papillomavirus (HPV).  Pubic lice.  Scabies.  Mites.  Bacterial vaginosis. WHAT ARE CAUSES OF STDs? An STD may be caused by bacteria, a virus, or parasites. STDs are often transmitted during sexual activity if one person is infected. However, they may also be transmitted through nonsexual means. STDs may be  transmitted after:   Sexual intercourse with an infected person.  Sharing sex toys with an infected person.  Sharing needles with an infected person or using unclean piercing or tattoo needles.  Having intimate contact with the genitals, mouth, or rectal areas of an infected person.  Exposure to infected fluids during birth. WHAT ARE THE SIGNS AND SYMPTOMS OF STDs? Different STDs have different symptoms. Some people may not have any symptoms. If symptoms are present, they may include:  Painful or bloody urination.  Pain in the pelvis, abdomen, vagina, anus, throat, or eyes.  A skin rash, itching, or irritation.  Growths, ulcerations, blisters, or sores in the genital and anal areas.  Abnormal vaginal discharge with or without bad odor.  Penile discharge in men.  Fever.  Pain or bleeding during sexual intercourse.  Swollen glands in the groin area.  Yellow skin and eyes (jaundice). This is seen with hepatitis.  Swollen testicles.  Infertility.  Sores and blisters in the mouth. HOW ARE STDs DIAGNOSED? To make a diagnosis, your health care provider may:  Take a medical history.  Perform a physical exam.  Take a sample of any discharge to examine.  Swab the throat, cervix, opening to the penis, rectum, or vagina for testing.  Test a  sample of your first morning urine.  Perform blood tests.  Perform a Pap test, if this applies.  Perform a colposcopy.  Perform a laparoscopy. HOW ARE STDs TREATED? Treatment depends on the STD. Some STDs may be treated but not cured.  Chlamydia, gonorrhea, trichomonas, and syphilis can be cured with antibiotic medicine.  Genital herpes, hepatitis, and HIV can be treated, but not cured, with prescribed medicines. The medicines lessen symptoms.  Genital warts from HPV can be treated with medicine or by freezing, burning (electrocautery), or surgery. Warts may come back.  HPV cannot be cured with medicine or surgery. However,  abnormal areas may be removed from the cervix, vagina, or vulva.  If your diagnosis is confirmed, your recent sexual partners need treatment. This is true even if they are symptom-free or have a negative culture or evaluation. They should not have sex until their health care providers say it is okay.  Your health care provider may test you for infection again 3 months after treatment. HOW CAN I REDUCE MY RISK OF GETTING AN STD? Take these steps to reduce your risk of getting an STD:  Use latex condoms, dental dams, and water-soluble lubricants during sexual activity. Do not use petroleum jelly or oils.  Avoid having multiple sex partners.  Do not have sex with someone who has other sex partners  Do not have sex with anyone you do not know or who is at high risk for an STD.  Avoid risky sex practices that can break your skin.  Do not have sex if you have open sores on your mouth or skin.  Avoid drinking too much alcohol or taking illegal drugs. Alcohol and drugs can affect your judgment and put you in a vulnerable position.  Avoid engaging in oral and anal sex acts.  Get vaccinated for HPV and hepatitis. If you have not received these vaccines in the past, talk to your health care provider about whether one or both might be right for you.  If you are at risk of being infected with HIV, it is recommended that you take a prescription medicine daily to prevent HIV infection. This is called pre-exposure prophylaxis (PrEP). You are considered at risk if:  You are a man who has sex with other men (MSM).  You are a heterosexual man or woman and are sexually active with more than one partner.  You take drugs by injection.  You are sexually active with a partner who has HIV.  Talk with your health care provider about whether you are at high risk of being infected with HIV. If you choose to begin PrEP, you should first be tested for HIV. You should then be tested every 3 months for as long  as you are taking PrEP. WHAT SHOULD I DO IF I THINK I HAVE AN STD?  See your health care provider.  Tell your sexual partner(s). They should be tested and treated for any STDs.  Do not have sex until your health care provider says it is okay. WHEN SHOULD I GET IMMEDIATE MEDICAL CARE? Contact your health care provider right away if:   You have severe abdominal pain.  You are a man and notice swelling or pain in your testicles.  You are a woman and notice swelling or pain in your vagina.   This information is not intended to replace advice given to you by your health care provider. Make sure you discuss any questions you have with your health care provider.  Document Released: 08/22/2002 Document Revised: 06/22/2014 Document Reviewed: 12/20/2012 Elsevier Interactive Patient Education [employer].

## 2015-08-24 NOTE — ED Provider Notes (Signed)
CSN: [id number]     Arrival date & time 08/24/15  0603 History   First MD Initiated Contact with Patient 08/24/15 [phone]     Chief Complaint  Patient presents with  . Testicle Pain     (Consider location/radiation/quality/duration/timing/severity/associated sxs/prior Treatment) HPI  This is a 35 year old male who presents requesting an STD check. Patient states that he has noted bilateral testicular "soreness". It is intermittent. It doesn't seem to get better or worse with anything. He reports urinary frequency and urgency. No discharge or dysuria. He reports one new sexual partner 2 weeks ago. Reports the condom broke.  Currently he denies any pain. History one year ago of chlamydia infection.  History reviewed. No pertinent past medical history. History reviewed. No pertinent past surgical history. No family history on file. Social History  Substance Use Topics  . Smoking status: Current Every Day Smoker -- 0.50 packs/day    Types: Cigarettes  . Smokeless tobacco: Never Used  . Alcohol Use: Yes     Comment: 3x week    Review of Systems  Gastrointestinal: Negative for nausea, vomiting and abdominal pain.  Genitourinary: Positive for urgency, frequency and testicular pain. Negative for dysuria and scrotal swelling.  All other systems reviewed and are negative.     Allergies  Review of patient's allergies indicates no known allergies.  Home Medications   Prior to Admission medications   Medication Sig Start Date End Date Taking? Authorizing Provider  HYDROcodone-acetaminophen (NORCO) 5-325 MG per tablet Take 1-2 tablets by mouth every 6 (six) hours as needed. 02/12/15   Geoffery Lyons, MD  ibuprofen (ADVIL,MOTRIN) 600 MG tablet Take 1 tablet (600 mg total) by mouth every 6 (six) hours as needed. 08/24/15   Shon Baton, MD  penicillin v potassium (VEETID) 500 MG tablet Take 1 tablet (500 mg total) by mouth 3 (three) times daily. 02/12/15   Geoffery Lyons, MD   BP 142/98 mmHg   Pulse 87  Temp(Src) 98.7 F (37.1 C)  Resp 17  Ht  (1.803 m)  Wt 185 lb (83.915 kg)  BMI 25.81 kg/m2  SpO2 100% Physical Exam  Constitutional: He is oriented to person, place, and time. He appears well-developed and well-nourished.  HENT:  Head: Normocephalic and atraumatic.  Cardiovascular: Normal rate and regular rhythm.   Pulmonary/Chest: Effort normal. No respiratory distress.  Genitourinary: Penis normal.  Normal circumcised penis, no testicular enlargement or significant tenderness to palpation, intact cremasteric reflex  no discharge noted, no masses  Musculoskeletal: He exhibits no edema.  Neurological: He is alert and oriented to person, place, and time.  Skin: Skin is warm and dry.  Psychiatric: He has a normal mood and affect.  Nursing note and vitals reviewed.   ED Course  Procedures (including critical care time) Labs Review Labs Reviewed  URINALYSIS, ROUTINE W REFLEX MICROSCOPIC (NOT AT [facility]) - Abnormal; Notable for the following:    Color, Urine AMBER (*)    Bilirubin Urine SMALL (*)    All other components within normal limits  HIV ANTIBODY (ROUTINE TESTING)  GC/CHLAMYDIA PROBE AMP ([place]) NOT AT [facility]    Imaging Review No results found. I have personally reviewed and evaluated these images and lab results as part of my medical decision-making.   EKG Interpretation None      MDM   Final diagnoses:  Concern about STD in male without diagnosis  Testicle pain  Epididymitis, bilateral    Patient presents requesting STD testing. Also reports bilateral testicular pain.  Physical exam is largely unremarkable. No mass or significant tenderness. Given that the pain is bilateral, have low suspicion for torsion. Suspect epididymitis especially given patient's concerns for STDs. He was tested and empirically treated with azithromycin and Rocephin.  He should inform sexual partners of any positive testing and abstain from sexual activity for the next  10 days.  After history, exam, and medical workup I feel the patient has been appropriately medically screened and is safe for discharge home. Pertinent diagnoses were discussed with the patient. Patient was given return precautions.     Shon Batonourtney F Horton, MD 08/25/15 [phone]

## 2015-08-26 LAB — GC/CHLAMYDIA PROBE AMP (~~LOC~~) NOT AT ARMC
CHLAMYDIA, DNA PROBE: NEGATIVE
Neisseria Gonorrhea: NEGATIVE

## 2015-08-27 LAB — HIV ANTIBODY (ROUTINE TESTING W REFLEX): HIV SCREEN 4TH GENERATION: NONREACTIVE

## 2015-08-31 ENCOUNTER — Encounter (HOSPITAL_BASED_OUTPATIENT_CLINIC_OR_DEPARTMENT_OTHER): Payer: Self-pay

## 2015-08-31 ENCOUNTER — Emergency Department (HOSPITAL_BASED_OUTPATIENT_CLINIC_OR_DEPARTMENT_OTHER)
Admission: EM | Admit: 2015-08-31 | Discharge: 2015-08-31 | Disposition: A | Discharge status: Home / Self Care | Payer: Self-pay | Attending: Emergency Medicine | Admitting: Emergency Medicine

## 2015-08-31 DIAGNOSIS — B356 Tinea cruris: Secondary | ICD-10-CM | POA: Insufficient documentation

## 2015-08-31 DIAGNOSIS — Z792 Long term (current) use of antibiotics: Secondary | ICD-10-CM | POA: Insufficient documentation

## 2015-08-31 DIAGNOSIS — F17210 Nicotine dependence, cigarettes, uncomplicated: Secondary | ICD-10-CM | POA: Insufficient documentation

## 2015-08-31 NOTE — ED Notes (Signed)
Pt c/o rash to left upper thigh for the last two days that itches

## 2015-08-31 NOTE — ED Notes (Signed)
Pt verbalizes understanding of d/c instructions and denies any further needs at this time. 

## 2015-08-31 NOTE — Discharge Instructions (Signed)
Jock Itch  Jock itch (tinea cruris) is a fungal infection of the skin in the groin area. It is sometimes called ringworm, even though it is not caused by worms. It is caused by a fungus, which is a type of germ that thrives in dark, damp places. Jock itch causes a rash and itching in the groin and upper thigh area. It usually goes away in 2-3 weeks with treatment.  CAUSES  The fungus that causes jock itch may be spread by:  · Touching a fungus infection elsewhere on your body--such as athlete's foot--and then touching your groin area.  · Sharing towels or clothing with an infected person.  RISK FACTORS  Jock itch is most common in men and adolescent boys. This condition is more likely to develop from:  · Being in hot, humid climates.  · Wearing tight-fitting clothing or wet bathing suits for long periods of time.  · Participating in sports.  · Being overweight.  · Having diabetes.  SYMPTOMS  Symptoms of jock itch may include:  · A red, pink, or brown rash in the groin area. The rash may spread to the thighs, anus, and buttocks.  · Dry and scaly skin on or around the rash.  · Itchiness.  DIAGNOSIS  Most often, a health care provider can make the diagnosis by looking at your rash. Sometimes, a scraping of the infected skin will be taken. This sample may be tested by looking at it under a microscope or by trying to grow the fungus from the sample (culture).   TREATMENT  Treatment for this condition may include:  · Antifungal medicine to kill the fungus. This may be in various forms:    Skin cream or ointment.    Medicine taken by mouth.  · Skin cream or ointment to reduce the itching.  · Compresses or medicated powders to dry the infected skin.  HOME CARE INSTRUCTIONS  · Take medicines only as directed by your health care provider. Apply skin creams or ointments exactly as directed.  · Wear loose-fitting clothing.    Men should wear cotton boxer shorts.    Women should wear cotton underwear.  · Change your underwear  every day to keep your groin dry.  · Avoid hot baths.  · Dry your groin area well after bathing.    Use a separate towel to dry your groin area. This will help to prevent a spreading of the infection to other areas of your body.  · Do not scratch the affected area.  · Do not share towels with other people.  SEEK MEDICAL CARE IF:  · Your rash does not improve or it gets worse after 2 weeks of treatment.  · Your rash is spreading.  · Your rash returns after treatment is finished.  · You have a fever.  · You have redness, swelling, or pain in the area around your rash.  · You have fluid, blood, or pus coming from your rash.  · Your have your rash for more than 4 weeks.     This information is not intended to replace advice given to you by your health care provider. Make sure you discuss any questions you have with your health care provider.     Document Released: 05/22/2002 Document Revised: 06/22/2014 Document Reviewed: 03/13/2014  Elsevier Interactive Patient Education ©2016 Elsevier Inc[employer]

## 2015-08-31 NOTE — ED Provider Notes (Signed)
CSN: [id number]     Arrival date & time 08/31/15  0604 History   First MD Initiated Contact with Patient 08/31/15 [phone]     Chief Complaint  Patient presents with  . Rash     (Consider location/radiation/quality/duration/timing/severity/associated sxs/prior Treatment) Patient is a 35 y.o. male presenting with rash. The history is provided by the patient.  Rash Location:  Ano-genital Ano-genital rash location:  Groin Quality: dryness, itchiness and scaling   Quality: not blistering, not swelling and not weeping   Severity:  Mild Onset quality:  Gradual Duration:  2 days Timing:  Constant Progression:  Unchanged Chronicity:  New Context: not animal contact   Relieved by:  Nothing Worsened by:  Nothing tried Ineffective treatments:  None tried Associated symptoms: no abdominal pain     History reviewed. No pertinent past medical history. History reviewed. No pertinent past surgical history. No family history on file. Social History  Substance Use Topics  . Smoking status: Current Every Day Smoker -- 0.50 packs/day    Types: Cigarettes  . Smokeless tobacco: Never Used  . Alcohol Use: Yes     Comment: 3x week    Review of Systems  Gastrointestinal: Negative for abdominal pain.  Skin: Positive for rash.  All other systems reviewed and are negative.     Allergies  Review of patient's allergies indicates no known allergies.  Home Medications   Prior to Admission medications   Medication Sig Start Date End Date Taking? Authorizing Provider  HYDROcodone-acetaminophen (NORCO) 5-325 MG per tablet Take 1-2 tablets by mouth every 6 (six) hours as needed. 02/12/15   Geoffery Lyonsouglas Delo, MD  ibuprofen (ADVIL,MOTRIN) 600 MG tablet Take 1 tablet (600 mg total) by mouth every 6 (six) hours as needed. 08/24/15   Shon Batonourtney F Horton, MD  penicillin v potassium (VEETID) 500 MG tablet Take 1 tablet (500 mg total) by mouth 3 (three) times daily. 02/12/15   Geoffery Lyonsouglas Delo, MD   BP 151/82 mmHg   Pulse 98  Temp(Src) 98.4 F (36.9 C) (Oral)  Resp 18  SpO2 100% Physical Exam  Constitutional: He is oriented to person, place, and time. He appears well-developed and well-nourished. No distress.  HENT:  Head: Normocephalic and atraumatic.  Eyes: Conjunctivae and EOM are normal.  Neck: Normal range of motion. Neck supple.  Cardiovascular: Normal rate and regular rhythm.   Pulmonary/Chest: Effort normal and breath sounds normal.  Abdominal: Soft. Bowel sounds are normal.  Genitourinary:  Chaperone present small area of tinea cruris in the left groin  Musculoskeletal: Normal range of motion.  Neurological: He is alert and oriented to person, place, and time.  Skin: Skin is warm and dry. Rash noted.    ED Course  Procedures (including critical care time) Labs Review Labs Reviewed - No data to display  Imaging Review No results found. I have personally reviewed and evaluated these images and lab results as part of my medical decision-making.   EKG Interpretation None      MDM   Final diagnoses:  Jock itch    Tinactin cream to the affected area twice daily x 7 days    Barnes Florek, MD 08/31/15 [phone]

## 2015-10-11 ENCOUNTER — Emergency Department (HOSPITAL_BASED_OUTPATIENT_CLINIC_OR_DEPARTMENT_OTHER)
Admission: EM | Admit: 2015-10-11 | Discharge: 2015-10-11 | Disposition: A | Discharge status: Home / Self Care | Payer: Self-pay | Attending: Emergency Medicine | Admitting: Emergency Medicine

## 2015-10-11 ENCOUNTER — Encounter (HOSPITAL_BASED_OUTPATIENT_CLINIC_OR_DEPARTMENT_OTHER): Payer: Self-pay

## 2015-10-11 DIAGNOSIS — R3915 Urgency of urination: Secondary | ICD-10-CM | POA: Insufficient documentation

## 2015-10-11 DIAGNOSIS — Z711 Person with feared health complaint in whom no diagnosis is made: Secondary | ICD-10-CM

## 2015-10-11 DIAGNOSIS — F17210 Nicotine dependence, cigarettes, uncomplicated: Secondary | ICD-10-CM | POA: Insufficient documentation

## 2015-10-11 LAB — URINALYSIS, ROUTINE W REFLEX MICROSCOPIC
Bilirubin Urine: NEGATIVE
Glucose, UA: NEGATIVE mg/dL
HGB URINE DIPSTICK: NEGATIVE
Ketones, ur: NEGATIVE mg/dL
Leukocytes, UA: NEGATIVE
Nitrite: NEGATIVE
PH: 5.5 (ref 5.0–8.0)
Protein, ur: NEGATIVE mg/dL
SPECIFIC GRAVITY, URINE: 1.026 (ref 1.005–1.030)

## 2015-10-11 MED ORDER — AZITHROMYCIN 250 MG PO TABS
1000.0000 mg | ORAL_TABLET | Freq: Once | ORAL | Status: AC
  Administered 2015-10-11: 1000 mg via ORAL
  Filled 2015-10-11: qty 4

## 2015-10-11 MED ORDER — CEFTRIAXONE SODIUM 250 MG IJ SOLR
250.0000 mg | Freq: Once | INTRAMUSCULAR | Status: AC
  Administered 2015-10-11: 250 mg via INTRAMUSCULAR
  Filled 2015-10-11: qty 250

## 2015-10-11 NOTE — ED Notes (Signed)
Pt verbalizes understanding of d/c instructions and denies any further need at this time. 

## 2015-10-11 NOTE — ED Notes (Signed)
Pt c/o urinary frequency and penile tingling since Easter.  He denies discharge, denies known STD exposure.

## 2015-10-11 NOTE — ED Provider Notes (Signed)
CSN: [id number]     Arrival date & time 10/11/15  [id number] History   First MD Initiated Contact with Patient 10/11/15 0331     Chief Complaint  Patient presents with  . Dysuria     (Consider location/radiation/quality/duration/timing/severity/associated sxs/prior Treatment) HPI  This is a 35 year old male with a history of Chlamydia. He is here with vaguely described urinary urgency for almost the past 2 weeks. Symptoms are mild. He denies urethral discharge or pain with urination. He denies abdominal pain.  History reviewed. No pertinent past medical history. History reviewed. No pertinent past surgical history. No family history on file. Social History  Substance Use Topics  . Smoking status: Current Every Day Smoker -- 0.50 packs/day    Types: Cigarettes  . Smokeless tobacco: Never Used  . Alcohol Use: Yes     Comment: 3x week    Review of Systems  All other systems reviewed and are negative.   Allergies  Review of patient's allergies indicates no known allergies.  Home Medications   Prior to Admission medications   Medication Sig Start Date End Date Taking? Authorizing Provider  HYDROcodone-acetaminophen (NORCO) 5-325 MG per tablet Take 1-2 tablets by mouth every 6 (six) hours as needed. 02/12/15   Geoffery Lyonsouglas Delo, MD  ibuprofen (ADVIL,MOTRIN) 600 MG tablet Take 1 tablet (600 mg total) by mouth every 6 (six) hours as needed. 08/24/15   Shon Batonourtney F Horton, MD  penicillin v potassium (VEETID) 500 MG tablet Take 1 tablet (500 mg total) by mouth 3 (three) times daily. 02/12/15   Geoffery Lyonsouglas Delo, MD   BP 143/89 mmHg  Pulse 98  Temp(Src) 98.7 F (37.1 C) (Oral)  Resp 18  Ht 5\' 11"  (1.803 m)  Wt 185 lb (83.915 kg)  BMI 25.81 kg/m2  SpO2 100%   Physical Exam  General: Well-developed, well-nourished male in no acute distress; appearance consistent with age of record HENT: normocephalic; atraumatic Eyes: Normal appearance Neck: supple Heart: regular rate and rhythm Lungs: Normal  respiratory effort and excursion Abdomen: soft; nondistended GU: Tanner 5 male, circumcised; no urethral discharge; no testicular mass or tenderness Extremities: No deformity; full range of motion Neurologic: Awake, alert and oriented; motor function intact in all extremities and symmetric; no facial droop Skin: Warm and dry Psychiatric: Normal mood and affect    ED Course  Procedures (including critical care time)   MDM  Nursing notes and vitals signs, including pulse oximetry, reviewed.  Summary of this visit's results, reviewed by myself:  Labs:  Results for orders placed or performed during the hospital encounter of 10/11/15 (from the past 24 hour(s))  Urinalysis, Routine w reflex microscopic ([facility])     Status: None   Collection Time: 10/11/15  3:30 AM  Result Value Ref Range   Color, Urine YELLOW YELLOW   APPearance CLEAR CLEAR   Specific Gravity, Urine 1.026 1.005 - 1.030   pH 5.5 5.0 - 8.0   Glucose, UA NEGATIVE NEGATIVE mg/dL   Hgb urine dipstick NEGATIVE NEGATIVE   Bilirubin Urine NEGATIVE NEGATIVE   Ketones, ur NEGATIVE NEGATIVE mg/dL   Protein, ur NEGATIVE NEGATIVE mg/dL   Nitrite NEGATIVE NEGATIVE   Leukocytes, UA NEGATIVE NEGATIVE       Paula LibraJohn Tephanie Escorcia, MD 10/11/15 0345

## 2015-10-14 LAB — GC/CHLAMYDIA PROBE AMP (~~LOC~~) NOT AT ARMC
CHLAMYDIA, DNA PROBE: NEGATIVE
NEISSERIA GONORRHEA: NEGATIVE

## 2015-12-13 IMAGING — CR DG LUMBAR SPINE COMPLETE 4+V
5 series · 5 of 5 positions shown · non-contrast
Comparison: None.

CLINICAL DATA: Lower back pain after fall down stairs.

EXAM:
LUMBAR SPINE - COMPLETE 4+ VIEW

[t l-spine a.p.]
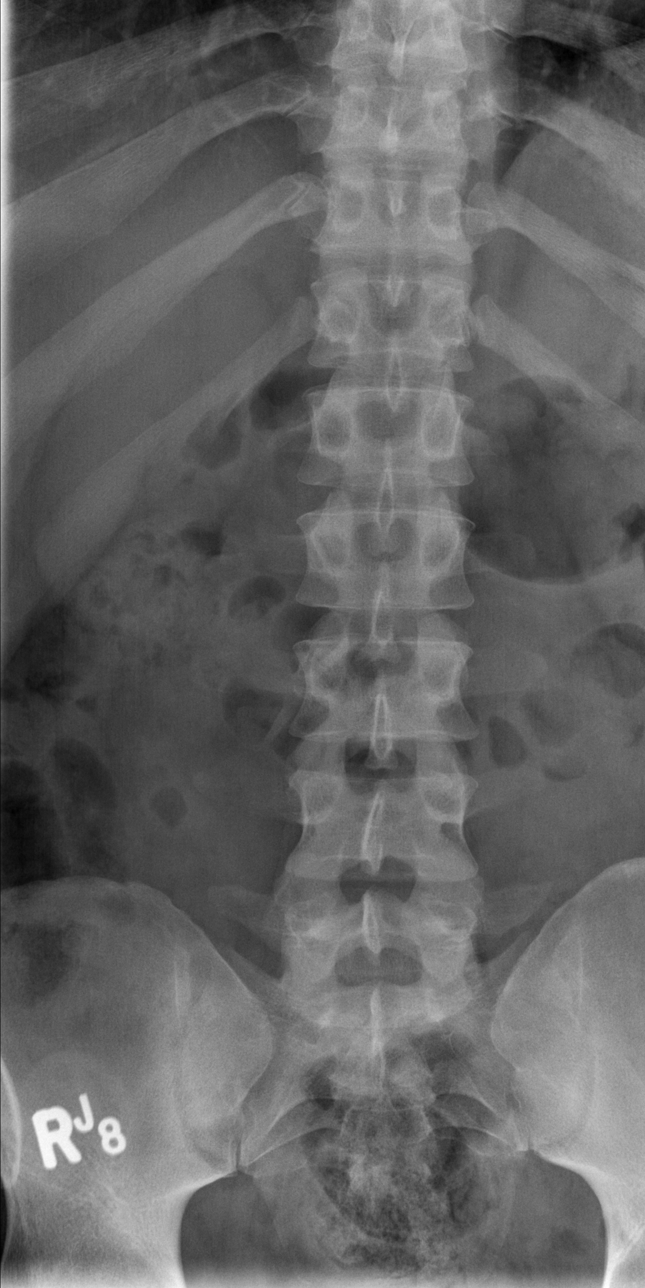

[t l-spine oblique exposure (1 of 2)]
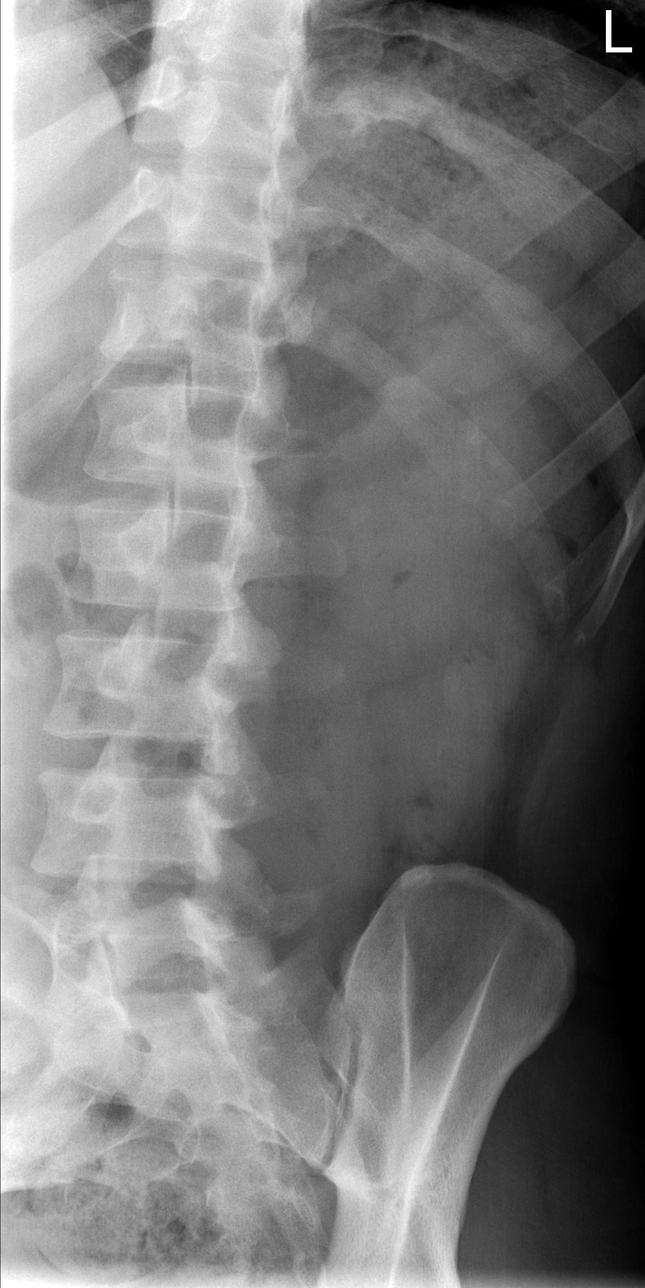

[t l-spine oblique exposure (2 of 2)]
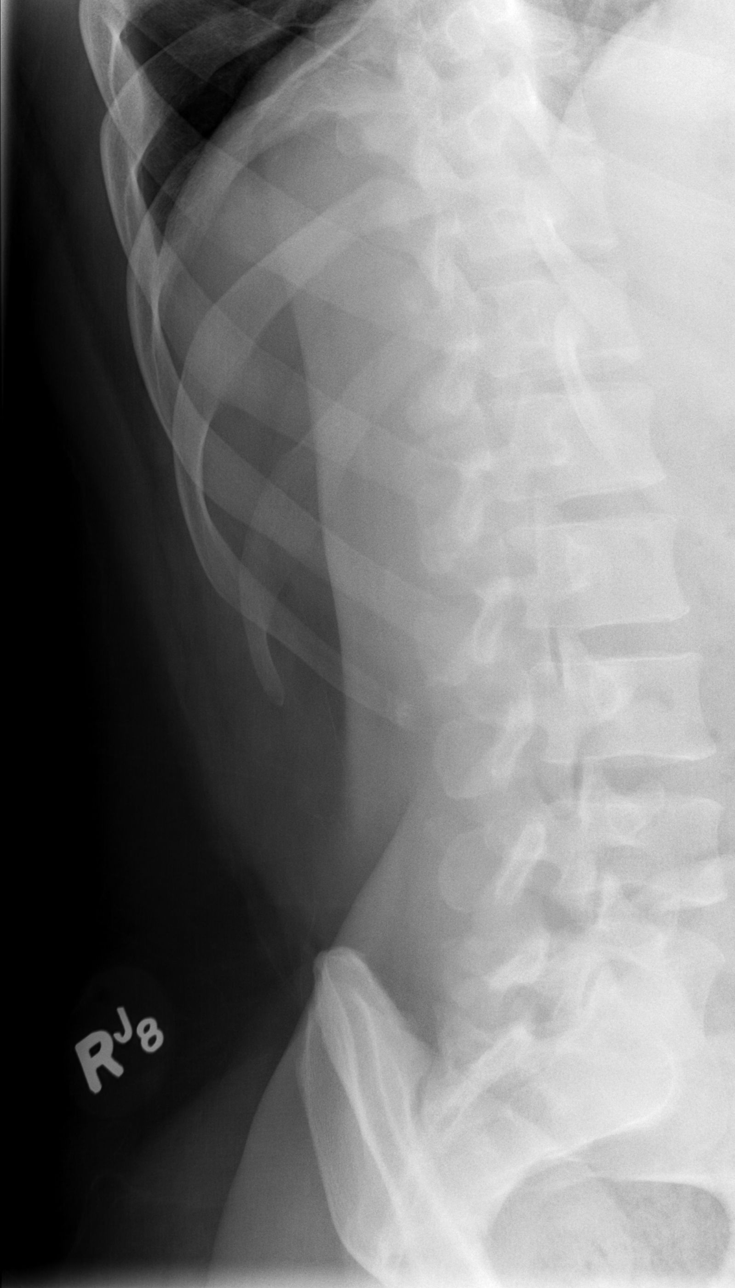

[t l-spine lat]
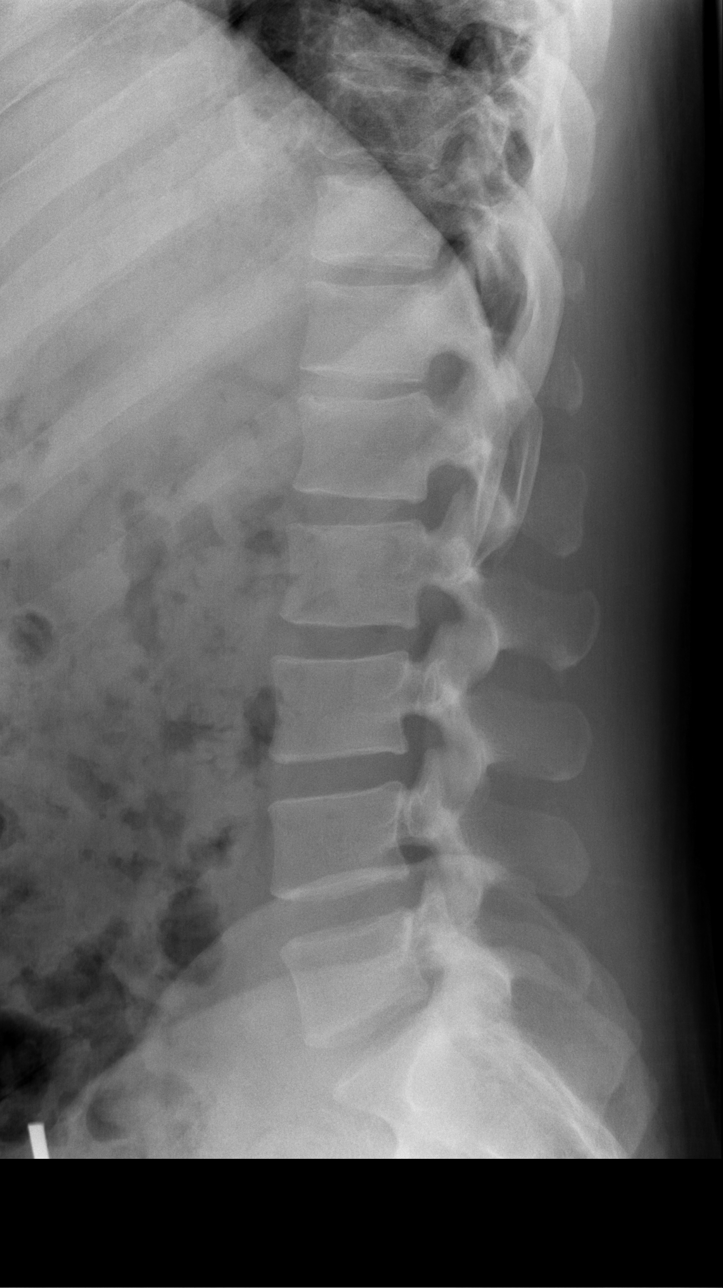

[t l-spine l5-s1 spot]
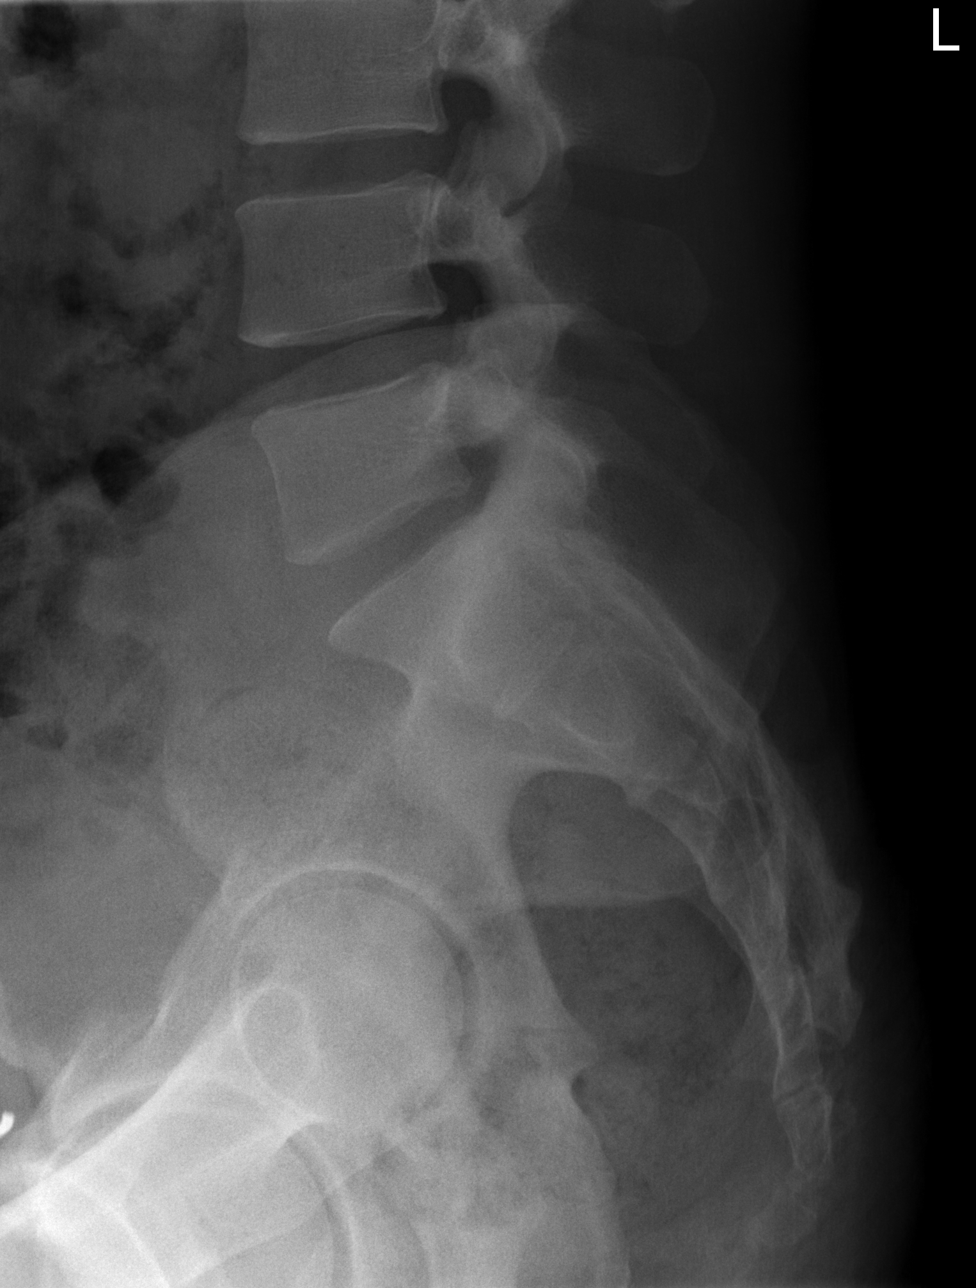

[5 of 5 positions shown; findings below may reference images not displayed]

FINDINGS: There is no evidence of lumbar spine fracture. Alignment is normal.
Intervertebral disc spaces are maintained.
IMPRESSION: Normal lumbar spine.
